# Patient Record
Sex: Female | Born: 1935 | Race: White | Hispanic: No | Marital: Married
Health system: Southern US, Community
[De-identification: ages and names within clinical notes are randomized; demographics above are authoritative.]

## PROBLEM LIST (undated history)

## (undated) DIAGNOSIS — I2699 Other pulmonary embolism without acute cor pulmonale: Secondary | ICD-10-CM

## (undated) DIAGNOSIS — J9621 Acute and chronic respiratory failure with hypoxia: Secondary | ICD-10-CM

## (undated) DIAGNOSIS — N182 Chronic kidney disease, stage 2 (mild): Secondary | ICD-10-CM

## (undated) DIAGNOSIS — J189 Pneumonia, unspecified organism: Secondary | ICD-10-CM

## (undated) DIAGNOSIS — U071 COVID-19: Secondary | ICD-10-CM

---

## 2020-03-16 ENCOUNTER — Inpatient Hospital Stay
Admission: AD | Admit: 2020-03-16 | Discharge: 2020-03-30 | Disposition: A | Discharge status: Skilled Nursing Facility | Payer: Medicare Other | Source: Other Acute Inpatient Hospital | Attending: Internal Medicine | Admitting: Internal Medicine

## 2020-03-16 DIAGNOSIS — J9690 Respiratory failure, unspecified, unspecified whether with hypoxia or hypercapnia: Secondary | ICD-10-CM

## 2020-03-16 DIAGNOSIS — I2699 Other pulmonary embolism without acute cor pulmonale: Secondary | ICD-10-CM | POA: Diagnosis present

## 2020-03-16 DIAGNOSIS — U071 COVID-19: Secondary | ICD-10-CM | POA: Diagnosis present

## 2020-03-16 DIAGNOSIS — J9621 Acute and chronic respiratory failure with hypoxia: Secondary | ICD-10-CM | POA: Diagnosis present

## 2020-03-16 DIAGNOSIS — J188 Other pneumonia, unspecified organism: Secondary | ICD-10-CM | POA: Diagnosis present

## 2020-03-16 DIAGNOSIS — N182 Chronic kidney disease, stage 2 (mild): Secondary | ICD-10-CM | POA: Diagnosis present

## 2020-03-16 DIAGNOSIS — J189 Pneumonia, unspecified organism: Secondary | ICD-10-CM | POA: Diagnosis present

## 2020-03-16 HISTORY — DX: Pneumonia, unspecified organism: J18.9

## 2020-03-16 HISTORY — DX: COVID-19: U07.1

## 2020-03-16 HISTORY — DX: Chronic kidney disease, stage 2 (mild): N18.2

## 2020-03-16 HISTORY — DX: Other pulmonary embolism without acute cor pulmonale: I26.99

## 2020-03-16 HISTORY — DX: Acute and chronic respiratory failure with hypoxia: J96.21

## 2020-03-17 ENCOUNTER — Other Ambulatory Visit (HOSPITAL_COMMUNITY): Payer: Medicare Other

## 2020-03-17 LAB — COMPREHENSIVE METABOLIC PANEL
ALT: 19 U/L (ref 0–44)
AST: 17 U/L (ref 15–41)
Albumin: 2.1 g/dL — ABNORMAL LOW (ref 3.5–5.0)
Alkaline Phosphatase: 76 U/L (ref 38–126)
Anion gap: 5 (ref 5–15)
BUN: 23 mg/dL (ref 8–23)
CO2: 27 mmol/L (ref 22–32)
Calcium: 8.1 mg/dL — ABNORMAL LOW (ref 8.9–10.3)
Chloride: 104 mmol/L (ref 98–111)
Creatinine, Ser: 0.9 mg/dL (ref 0.44–1.00)
GFR calc Af Amer: >60 mL/min (ref >60)
GFR calc non Af Amer: 58 mL/min — ABNORMAL LOW (ref >60)
Glucose, Bld: 112 mg/dL — ABNORMAL HIGH (ref 70–99)
Potassium: 4.3 mmol/L (ref 3.5–5.1)
Sodium: 136 mmol/L (ref 135–145)
Total Bilirubin: 0.5 mg/dL (ref 0.3–1.2)
Total Protein: 5.2 g/dL — ABNORMAL LOW (ref 6.5–8.1)

## 2020-03-17 LAB — URINALYSIS, ROUTINE W REFLEX MICROSCOPIC
Bilirubin Urine: NEGATIVE
Glucose, UA: NEGATIVE mg/dL
Hgb urine dipstick: NEGATIVE
Ketones, ur: NEGATIVE mg/dL
Leukocytes,Ua: NEGATIVE
Nitrite: NEGATIVE
Protein, ur: NEGATIVE mg/dL
Specific Gravity, Urine: 1.018 (ref 1.005–1.030)
pH: 7 (ref 5.0–8.0)

## 2020-03-17 LAB — PROTIME-INR
INR: 1.6 — ABNORMAL HIGH (ref 0.8–1.2)
Prothrombin Time: 18.8 seconds — ABNORMAL HIGH (ref 11.4–15.2)

## 2020-03-17 LAB — CBC
HCT: 34.6 % — ABNORMAL LOW (ref 36.0–46.0)
Hemoglobin: 11.1 g/dL — ABNORMAL LOW (ref 12.0–15.0)
MCH: 29 pg (ref 26.0–34.0)
MCHC: 32.1 g/dL (ref 30.0–36.0)
MCV: 90.3 fL (ref 80.0–100.0)
Platelets: 271 10*3/uL (ref 150–400)
RBC: 3.83 MIL/uL — ABNORMAL LOW (ref 3.87–5.11)
RDW: 14.9 % (ref 11.5–15.5)
WBC: 16.5 10*3/uL — ABNORMAL HIGH (ref 4.0–10.5)
nRBC: 0 % (ref 0.0–0.2)

## 2020-03-17 LAB — PHOSPHORUS: Phosphorus: 2.8 mg/dL (ref 2.5–4.6)

## 2020-03-17 LAB — MAGNESIUM: Magnesium: 2.3 mg/dL (ref 1.7–2.4)

## 2020-03-18 ENCOUNTER — Encounter: Payer: Self-pay | Admitting: Internal Medicine

## 2020-03-18 DIAGNOSIS — J9621 Acute and chronic respiratory failure with hypoxia: Secondary | ICD-10-CM

## 2020-03-18 DIAGNOSIS — I2699 Other pulmonary embolism without acute cor pulmonale: Secondary | ICD-10-CM | POA: Diagnosis not present

## 2020-03-18 DIAGNOSIS — U071 COVID-19: Secondary | ICD-10-CM | POA: Diagnosis present

## 2020-03-18 DIAGNOSIS — J188 Other pneumonia, unspecified organism: Secondary | ICD-10-CM | POA: Diagnosis present

## 2020-03-18 DIAGNOSIS — J189 Pneumonia, unspecified organism: Secondary | ICD-10-CM

## 2020-03-18 DIAGNOSIS — N182 Chronic kidney disease, stage 2 (mild): Secondary | ICD-10-CM | POA: Diagnosis present

## 2020-03-18 LAB — URINE CULTURE: Culture: NO GROWTH

## 2020-03-18 NOTE — Consult Note (Signed)
Pulmonary Critical Care Medicine [name]  PULMONARY SERVICE  Date of Service: 03/18/2020  PULMONARY CRITICAL CARE CONSULT   Jamie Blackburn  YIF:[id number]  DOB: January 04, 1931   DOA: 03/16/2020  Referring Physician: Carron Curie, MD  HPI: Jamie Blackburn is a 86 y.o. female seen for follow up of Acute on Chronic Respiratory Failure.  Patient has multiple medical problems including chronic anemia GERD hypertension and chronic kidney disease fibromyalgia who presented to the hospital with increasing fatigue.  Patient had a chest x-ray done which was concerning for pneumonia.  COVID-19 test was performed and it was positive.  Patient was started on steroids remdesivir oxygen was increased to 12 L.  Subsequently patient had a gradually weaned down.  Chest CT angiogram was done which showed bilateral pulmonary embolism.  Patient was started on anticoagulation transitioned over to Eliquis.  Transferred now to our facility for further management and weaning  Review of Systems:  ROS performed and is unremarkable other than noted above.  Past medical history: Chronic anemia Chronic kidney disease Fibromyalgia GERD Hypertension Iron deficiency  Past surgical history: Appendectomy colonoscopy Hysterectomy Total knee arthroplasty Wrist fracture surgery  Social history: Negative for alcohol tobacco or drug abuse  Family history: Noncontributory to present illness  Allergies: Morphine Sulfa  Medications: Reviewed on Rounds  Physical Exam:  Vitals: Temperature 95.8 pulse 82 respiratory rate is 16 blood pressure is 122/59 saturations 98%  Ventilator Settings currently on 6 L off the ventilator  . General: Comfortable at this time . Eyes: Grossly normal lids, irises & conjunctiva . ENT: grossly tongue is normal . Neck: no obvious mass . Cardiovascular: S1-S2 normal no gallop or rub . Respiratory: No rhonchi no rales are noted at this time . Abdomen: Soft and  nontender . Skin: no rash seen on limited exam . Musculoskeletal: not rigid . Psychiatric:unable to assess . Neurologic: no seizure no involuntary movements         Labs on Admission:  Basic Metabolic Panel: Recent Labs  Lab 03/17/20 0641  NA 136  K 4.3  CL 104  CO2 27  GLUCOSE 112*  BUN 23  CREATININE 0.90  CALCIUM 8.1*  MG 2.3  PHOS 2.8    No results for input(s): PHART, PCO2ART, PO2ART, HCO3, O2SAT in the last 168 hours.  Liver Function Tests: Recent Labs  Lab 03/17/20 0641  AST 17  ALT 19  ALKPHOS 76  BILITOT 0.5  PROT 5.2*  ALBUMIN 2.1*   No results for input(s): LIPASE, AMYLASE in the last 168 hours. No results for input(s): AMMONIA in the last 168 hours.  CBC: Recent Labs  Lab 03/17/20 0641  WBC 16.5*  HGB 11.1*  HCT 34.6*  MCV 90.3  PLT 271    Cardiac Enzymes: No results for input(s): CKTOTAL, CKMB, CKMBINDEX, TROPONINI in the last 168 hours.  BNP (last 3 results) No results for input(s): BNP in the last 8760 hours.  ProBNP (last 3 results) No results for input(s): PROBNP in the last 8760 hours.   Radiological Exams on Admission: DG CHEST PORT 1 VIEW  Result Date: 03/17/2020 CLINICAL DATA:  Respiratory failure EXAM: PORTABLE CHEST 1 VIEW COMPARISON:  None. FINDINGS: The heart size and mediastinal contours are within normal limits. There is hazy patchy airspace opacity seen at the periphery of the left lower lung. There is prominence of the central pulmonary vasculature. No pleural effusion is seen. No acute osseous abnormality IMPRESSION: Airspace opacity at the periphery of the left lower  lobe which may be due to atelectasis and/or infectious etiology. Electronically Signed   By: Prudencio Pair M.D.   On: 03/17/2020 06:15    Assessment/Plan Active Problems:   Acute on chronic respiratory failure with hypoxia (HCC)   COVID-19 virus infection   Multifocal pneumonia   Bilateral pulmonary embolism (HCC)   Chronic kidney disease, stage II  (mild)   1. Acute on chronic respiratory failure with hypoxia patient currently is weaned down to 6 L of O2 we will continue to decrease oxygen as tolerated. 2. COVID-19 virus infection now is in recovery phase plan is going to be to continue to monitor oxygen requirements and wean as ordered. 3. Bilateral pulmonary embolisms treated on Eliquis we will continue with the anticoagulation. 4. Chronic kidney disease stage 2 supportive care monitor labs we will continue to follow along. 5. Bilateral multifocal pneumonia treated patient still has some airspace disease left over we will continue to follow along.  I have personally seen and evaluated the patient, evaluated laboratory and imaging results, formulated the assessment and plan and placed orders. The Patient requires high complexity decision making with multiple systems involvement.  Case was discussed on Rounds with the Respiratory Therapy Director and the Respiratory staff Time Spent 38minutes  Aitana Burry A Jaely Silman, MD [facility] Pulmonary Critical Care Medicine Sleep Medicine

## 2020-03-19 DIAGNOSIS — U071 COVID-19: Secondary | ICD-10-CM | POA: Diagnosis not present

## 2020-03-19 DIAGNOSIS — J9621 Acute and chronic respiratory failure with hypoxia: Secondary | ICD-10-CM | POA: Diagnosis not present

## 2020-03-19 DIAGNOSIS — I2699 Other pulmonary embolism without acute cor pulmonale: Secondary | ICD-10-CM | POA: Diagnosis not present

## 2020-03-19 DIAGNOSIS — N182 Chronic kidney disease, stage 2 (mild): Secondary | ICD-10-CM | POA: Diagnosis not present

## 2020-03-19 NOTE — Progress Notes (Signed)
Pulmonary Critical Care Medicine [facility] [place]   PULMONARY CRITICAL CARE SERVICE  PROGRESS NOTE  Date of Service: 03/19/2020  Jamie Blackburn  QVZ:[id number]  DOB: 11-Mar-1931   DOA: 03/16/2020  Referring Physician: Carron Curie, MD  HPI: Jamie Blackburn is a 86 y.o. female seen for follow up of Acute on Chronic Respiratory Failure.  Patient is on 6 L O2 good saturations are noted  Medications: Reviewed on Rounds  Physical Exam:  Vitals: Temperature 95.8 pulse 90 respiratory rate 16 blood pressures 130/92 saturations 100%  Ventilator Settings on 6 L oxygen good saturations  . General: Comfortable at this time . Eyes: Grossly normal lids, irises & conjunctiva . ENT: grossly tongue is normal . Neck: no obvious mass . Cardiovascular: S1 S2 normal no gallop . Respiratory: No rhonchi coarse breath sounds . Abdomen: soft . Skin: no rash seen on limited exam . Musculoskeletal: not rigid . Psychiatric:unable to assess . Neurologic: no seizure no involuntary movements         Lab Data:   Basic Metabolic Panel: Recent Labs  Lab 03/17/20 0641  NA 136  K 4.3  CL 104  CO2 27  GLUCOSE 112*  BUN 23  CREATININE 0.90  CALCIUM 8.1*  MG 2.3  PHOS 2.8    ABG: No results for input(s): PHART, PCO2ART, PO2ART, HCO3, O2SAT in the last 168 hours.  Liver Function Tests: Recent Labs  Lab 03/17/20 0641  AST 17  ALT 19  ALKPHOS 76  BILITOT 0.5  PROT 5.2*  ALBUMIN 2.1*   No results for input(s): LIPASE, AMYLASE in the last 168 hours. No results for input(s): AMMONIA in the last 168 hours.  CBC: Recent Labs  Lab 03/17/20 0641  WBC 16.5*  HGB 11.1*  HCT 34.6*  MCV 90.3  PLT 271    Cardiac Enzymes: No results for input(s): CKTOTAL, CKMB, CKMBINDEX, TROPONINI in the last 168 hours.  BNP (last 3 results) No results for input(s): BNP in the last 8760 hours.  ProBNP (last 3 results) No results for input(s): PROBNP in the last 8760  hours.  Radiological Exams: No results found.  Assessment/Plan Active Problems:   Acute on chronic respiratory failure with hypoxia (HCC)   COVID-19 virus infection   Multifocal pneumonia   Bilateral pulmonary embolism (HCC)   Chronic kidney disease, stage II (mild)   1. Acute on chronic respiratory failure hypoxia continue with oxygen therapy right now on 6 L O2 2. COVID-19 virus infection treated clinically is improving 3. Multifocal pneumonia clinically improving 4. Bilateral pulmonary embolism no change we will continue to follow 5. Chronic kidney disease stage III supportive care monitoring labs   I have personally seen and evaluated the patient, evaluated laboratory and imaging results, formulated the assessment and plan and placed orders. The Patient requires high complexity decision making with multiple systems involvement.  Rounds were done with the Respiratory Therapy Director and Staff therapists and discussed with nursing staff also.  Yevonne Pax, MD [facility] Pulmonary Critical Care Medicine Sleep Medicine

## 2020-03-20 DIAGNOSIS — J9621 Acute and chronic respiratory failure with hypoxia: Secondary | ICD-10-CM | POA: Diagnosis not present

## 2020-03-20 DIAGNOSIS — N182 Chronic kidney disease, stage 2 (mild): Secondary | ICD-10-CM | POA: Diagnosis not present

## 2020-03-20 DIAGNOSIS — U071 COVID-19: Secondary | ICD-10-CM | POA: Diagnosis not present

## 2020-03-20 DIAGNOSIS — I2699 Other pulmonary embolism without acute cor pulmonale: Secondary | ICD-10-CM | POA: Diagnosis not present

## 2020-03-20 LAB — BASIC METABOLIC PANEL
Anion gap: 7 (ref 5–15)
BUN: 26 mg/dL — ABNORMAL HIGH (ref 8–23)
CO2: 25 mmol/L (ref 22–32)
Calcium: 8.6 mg/dL — ABNORMAL LOW (ref 8.9–10.3)
Chloride: 108 mmol/L (ref 98–111)
Creatinine, Ser: 0.78 mg/dL (ref 0.44–1.00)
GFR calc Af Amer: >60 mL/min (ref >60)
GFR calc non Af Amer: >60 mL/min (ref >60)
Glucose, Bld: 102 mg/dL — ABNORMAL HIGH (ref 70–99)
Potassium: 4.7 mmol/L (ref 3.5–5.1)
Sodium: 140 mmol/L (ref 135–145)

## 2020-03-20 LAB — CBC
HCT: 30.8 % — ABNORMAL LOW (ref 36.0–46.0)
Hemoglobin: 10 g/dL — ABNORMAL LOW (ref 12.0–15.0)
MCH: 29.2 pg (ref 26.0–34.0)
MCHC: 32.5 g/dL (ref 30.0–36.0)
MCV: 90.1 fL (ref 80.0–100.0)
Platelets: 294 10*3/uL (ref 150–400)
RBC: 3.42 MIL/uL — ABNORMAL LOW (ref 3.87–5.11)
RDW: 14.7 % (ref 11.5–15.5)
WBC: 12.9 10*3/uL — ABNORMAL HIGH (ref 4.0–10.5)
nRBC: 0 % (ref 0.0–0.2)

## 2020-03-20 LAB — MAGNESIUM: Magnesium: 2.1 mg/dL (ref 1.7–2.4)

## 2020-03-20 NOTE — Progress Notes (Signed)
Pulmonary Critical Care Medicine [facility] [place]   PULMONARY CRITICAL CARE SERVICE  PROGRESS NOTE  Date of Service: 03/20/2020  Jamie Blackburn  YPP:[id number]  DOB: [birth year]   DOA: 03/16/2020  Referring Physician: Carron Curie, MD  HPI: Jamie Blackburn is a 86 y.o. female seen for follow up of Acute on Chronic Respiratory Failure.  Patient is on 6 L O2 saturations are good  Medications: Reviewed on Rounds  Physical Exam:  Vitals: Temperature is 97.4 pulse 89 respiratory rate 18 blood pressure is 104/60 saturations 99%  Ventilator Settings on 6 L oxygen  . General: Comfortable at this time . Eyes: Grossly normal lids, irises & conjunctiva . ENT: grossly tongue is normal . Neck: no obvious mass . Cardiovascular: S1 S2 normal no gallop . Respiratory: No rhonchi coarse breath sounds . Abdomen: soft . Skin: no rash seen on limited exam . Musculoskeletal: not rigid . Psychiatric:unable to assess . Neurologic: no seizure no involuntary movements         Lab Data:   Basic Metabolic Panel: Recent Labs  Lab 03/17/20 0641 03/20/20 0701  NA 136 140  K 4.3 4.7  CL 104 108  CO2 27 25  GLUCOSE 112* 102*  BUN 23 26*  CREATININE 0.90 0.78  CALCIUM 8.1* 8.6*  MG 2.3 2.1  PHOS 2.8  --     ABG: No results for input(s): PHART, PCO2ART, PO2ART, HCO3, O2SAT in the last 168 hours.  Liver Function Tests: Recent Labs  Lab 03/17/20 0641  AST 17  ALT 19  ALKPHOS 76  BILITOT 0.5  PROT 5.2*  ALBUMIN 2.1*   No results for input(s): LIPASE, AMYLASE in the last 168 hours. No results for input(s): AMMONIA in the last 168 hours.  CBC: Recent Labs  Lab 03/17/20 0641 03/20/20 0701  WBC 16.5* 12.9*  HGB 11.1* 10.0*  HCT 34.6* 30.8*  MCV 90.3 90.1  PLT 271 294    Cardiac Enzymes: No results for input(s): CKTOTAL, CKMB, CKMBINDEX, TROPONINI in the last 168 hours.  BNP (last 3 results) No results for input(s): BNP in the last 8760 hours.  ProBNP (last  3 results) No results for input(s): PROBNP in the last 8760 hours.  Radiological Exams: No results found.  Assessment/Plan Active Problems:   Acute on chronic respiratory failure with hypoxia (HCC)   COVID-19 virus infection   Multifocal pneumonia   Bilateral pulmonary embolism (HCC)   Chronic kidney disease, stage II (mild)   1. Acute on chronic respiratory failure hypoxia we will continue with oxygen therapy and supportive care. 2. COVID-19 virus infection treated continue to follow 3. Multifocal pneumonia treated improving slowly 4. Bilateral pulmonary embolism treated continue with supportive care 5. Chronic kidney disease stage II following labs   I have personally seen and evaluated the patient, evaluated laboratory and imaging results, formulated the assessment and plan and placed orders. The Patient requires high complexity decision making with multiple systems involvement.  Rounds were done with the Respiratory Therapy Director and Staff therapists and discussed with nursing staff also.  Yevonne Pax, MD [facility] Pulmonary Critical Care Medicine Sleep Medicine

## 2020-03-21 DIAGNOSIS — N182 Chronic kidney disease, stage 2 (mild): Secondary | ICD-10-CM | POA: Diagnosis not present

## 2020-03-21 DIAGNOSIS — I2699 Other pulmonary embolism without acute cor pulmonale: Secondary | ICD-10-CM | POA: Diagnosis not present

## 2020-03-21 DIAGNOSIS — U071 COVID-19: Secondary | ICD-10-CM | POA: Diagnosis not present

## 2020-03-21 DIAGNOSIS — J9621 Acute and chronic respiratory failure with hypoxia: Secondary | ICD-10-CM | POA: Diagnosis not present

## 2020-03-21 NOTE — Progress Notes (Signed)
Pulmonary Critical Care Medicine [facility] [place]   PULMONARY CRITICAL CARE SERVICE  PROGRESS NOTE  Date of Service: 03/21/2020  Jamie Blackburn  EXN:[id number]  DOB: 1930-12-02   DOA: 03/16/2020  Referring Physician: Carron Curie, MD  HPI: Jamie Blackburn is a 86 y.o. female seen for follow up of Acute on Chronic Respiratory Failure.  Patient is off the ventilator right now is on 6 L O2 good saturations are noted  Medications: Reviewed on Rounds  Physical Exam:  Vitals: Temperature is 97.9 pulse 84 respiratory 20 blood pressure is 119/61 saturations 100%  Ventilator Settings on 6 L O2  . General: Comfortable at this time . Eyes: Grossly normal lids, irises & conjunctiva . ENT: grossly tongue is normal . Neck: no obvious mass . Cardiovascular: S1 S2 normal no gallop . Respiratory: No rhonchi no rales are noted at this time . Abdomen: soft . Skin: no rash seen on limited exam . Musculoskeletal: not rigid . Psychiatric:unable to assess . Neurologic: no seizure no involuntary movements         Lab Data:   Basic Metabolic Panel: Recent Labs  Lab 03/17/20 0641 03/20/20 0701  NA 136 140  K 4.3 4.7  CL 104 108  CO2 27 25  GLUCOSE 112* 102*  BUN 23 26*  CREATININE 0.90 0.78  CALCIUM 8.1* 8.6*  MG 2.3 2.1  PHOS 2.8  --     ABG: No results for input(s): PHART, PCO2ART, PO2ART, HCO3, O2SAT in the last 168 hours.  Liver Function Tests: Recent Labs  Lab 03/17/20 0641  AST 17  ALT 19  ALKPHOS 76  BILITOT 0.5  PROT 5.2*  ALBUMIN 2.1*   No results for input(s): LIPASE, AMYLASE in the last 168 hours. No results for input(s): AMMONIA in the last 168 hours.  CBC: Recent Labs  Lab 03/17/20 0641 03/20/20 0701  WBC 16.5* 12.9*  HGB 11.1* 10.0*  HCT 34.6* 30.8*  MCV 90.3 90.1  PLT 271 294    Cardiac Enzymes: No results for input(s): CKTOTAL, CKMB, CKMBINDEX, TROPONINI in the last 168 hours.  BNP (last 3 results) No results for input(s): BNP  in the last 8760 hours.  ProBNP (last 3 results) No results for input(s): PROBNP in the last 8760 hours.  Radiological Exams: No results found.  Assessment/Plan Active Problems:   Acute on chronic respiratory failure with hypoxia (HCC)   COVID-19 virus infection   Multifocal pneumonia   Bilateral pulmonary embolism (HCC)   Chronic kidney disease, stage II (mild)   1. Acute on chronic respiratory failure with hypoxia continue with oxygen therapy wean on 6 L try to wean down 2. COVID-19 virus infection in recovery phase continue to follow 3. Multifocal pneumonia supportive care we will continue with present management 4. Bilateral pulmonary embolism 5. Chronic kidney disease stage II continue to follow labs   I have personally seen and evaluated the patient, evaluated laboratory and imaging results, formulated the assessment and plan and placed orders. The Patient requires high complexity decision making with multiple systems involvement.  Rounds were done with the Respiratory Therapy Director and Staff therapists and discussed with nursing staff also.  Yevonne Pax, MD [facility] Pulmonary Critical Care Medicine Sleep Medicine

## 2020-03-22 DIAGNOSIS — I2699 Other pulmonary embolism without acute cor pulmonale: Secondary | ICD-10-CM | POA: Diagnosis not present

## 2020-03-22 DIAGNOSIS — U071 COVID-19: Secondary | ICD-10-CM | POA: Diagnosis not present

## 2020-03-22 DIAGNOSIS — J9621 Acute and chronic respiratory failure with hypoxia: Secondary | ICD-10-CM | POA: Diagnosis not present

## 2020-03-22 DIAGNOSIS — N182 Chronic kidney disease, stage 2 (mild): Secondary | ICD-10-CM | POA: Diagnosis not present

## 2020-03-22 NOTE — Progress Notes (Signed)
Pulmonary Critical Care Medicine [facility] [place]   PULMONARY CRITICAL CARE SERVICE  PROGRESS NOTE  Date of Service: 03/22/2020  Jamie Blackburn  [id number]  DOB: 02-Apr-1931   DOA: 03/16/2020  Referring Physician: Carron Curie, MD  HPI: Jamie Blackburn is a 86 y.o. female seen for follow up of Acute on Chronic Respiratory Failure.  Currently is on 6 L oxygen doing well  Medications: Reviewed on Rounds  Physical Exam:  Vitals: Temperature is 97.4 pulse 77 respiratory rate 17 blood pressure is 130/73 saturations 100%  Ventilator Settings on 6 L O2  . General: Comfortable at this time . Eyes: Grossly normal lids, irises & conjunctiva . ENT: grossly tongue is normal . Neck: no obvious mass . Cardiovascular: S1 S2 normal no gallop . Respiratory: No rhonchi no rales are noted at this time . Abdomen: soft . Skin: no rash seen on limited exam . Musculoskeletal: not rigid . Psychiatric:unable to assess . Neurologic: no seizure no involuntary movements         Lab Data:   Basic Metabolic Panel: Recent Labs  Lab 03/17/20 0641 03/20/20 0701  NA 136 140  K 4.3 4.7  CL 104 108  CO2 27 25  GLUCOSE 112* 102*  BUN 23 26*  CREATININE 0.90 0.78  CALCIUM 8.1* 8.6*  MG 2.3 2.1  PHOS 2.8  --     ABG: No results for input(s): PHART, PCO2ART, PO2ART, HCO3, O2SAT in the last 168 hours.  Liver Function Tests: Recent Labs  Lab 03/17/20 0641  AST 17  ALT 19  ALKPHOS 76  BILITOT 0.5  PROT 5.2*  ALBUMIN 2.1*   No results for input(s): LIPASE, AMYLASE in the last 168 hours. No results for input(s): AMMONIA in the last 168 hours.  CBC: Recent Labs  Lab 03/17/20 0641 03/20/20 0701  WBC 16.5* 12.9*  HGB 11.1* 10.0*  HCT 34.6* 30.8*  MCV 90.3 90.1  PLT 271 294    Cardiac Enzymes: No results for input(s): CKTOTAL, CKMB, CKMBINDEX, TROPONINI in the last 168 hours.  BNP (last 3 results) No results for input(s): BNP in the last 8760 hours.  ProBNP  (last 3 results) No results for input(s): PROBNP in the last 8760 hours.  Radiological Exams: No results found.  Assessment/Plan Active Problems:   Acute on chronic respiratory failure with hypoxia (HCC)   COVID-19 virus infection   Multifocal pneumonia   Bilateral pulmonary embolism (HCC)   Chronic kidney disease, stage II (mild)   1. Acute on chronic respiratory failure hypoxia plan is to continue with oxygen therapy titrate as tolerated continue pulmonary toilet. 2. COVID-19 virus infection resolved 3. Multifocal pneumonia clinically improving 4. Bilateral pulmonary embolism continue with present management 5. Chronic kidney disease stage II supportive care   I have personally seen and evaluated the patient, evaluated laboratory and imaging results, formulated the assessment and plan and placed orders. The Patient requires high complexity decision making with multiple systems involvement.  Rounds were done with the Respiratory Therapy Director and Staff therapists and discussed with nursing staff also.  Yevonne Pax, MD [facility] Pulmonary Critical Care Medicine Sleep Medicine

## 2020-03-23 DIAGNOSIS — J9621 Acute and chronic respiratory failure with hypoxia: Secondary | ICD-10-CM | POA: Diagnosis not present

## 2020-03-23 DIAGNOSIS — N182 Chronic kidney disease, stage 2 (mild): Secondary | ICD-10-CM | POA: Diagnosis not present

## 2020-03-23 DIAGNOSIS — I2699 Other pulmonary embolism without acute cor pulmonale: Secondary | ICD-10-CM | POA: Diagnosis not present

## 2020-03-23 DIAGNOSIS — U071 COVID-19: Secondary | ICD-10-CM | POA: Diagnosis not present

## 2020-03-23 LAB — CBC
HCT: 31.2 % — ABNORMAL LOW (ref 36.0–46.0)
Hemoglobin: 9.8 g/dL — ABNORMAL LOW (ref 12.0–15.0)
MCH: 28.9 pg (ref 26.0–34.0)
MCHC: 31.4 g/dL (ref 30.0–36.0)
MCV: 92 fL (ref 80.0–100.0)
Platelets: 289 10*3/uL (ref 150–400)
RBC: 3.39 MIL/uL — ABNORMAL LOW (ref 3.87–5.11)
RDW: 14.9 % (ref 11.5–15.5)
WBC: 12.4 10*3/uL — ABNORMAL HIGH (ref 4.0–10.5)
nRBC: 0 % (ref 0.0–0.2)

## 2020-03-23 LAB — BASIC METABOLIC PANEL
Anion gap: 7 (ref 5–15)
BUN: 31 mg/dL — ABNORMAL HIGH (ref 8–23)
CO2: 27 mmol/L (ref 22–32)
Calcium: 8.8 mg/dL — ABNORMAL LOW (ref 8.9–10.3)
Chloride: 105 mmol/L (ref 98–111)
Creatinine, Ser: 0.86 mg/dL (ref 0.44–1.00)
GFR calc Af Amer: >60 mL/min (ref >60)
GFR calc non Af Amer: >60 mL/min (ref >60)
Glucose, Bld: 96 mg/dL (ref 70–99)
Potassium: 4.8 mmol/L (ref 3.5–5.1)
Sodium: 139 mmol/L (ref 135–145)

## 2020-03-23 LAB — MAGNESIUM: Magnesium: 2.1 mg/dL (ref 1.7–2.4)

## 2020-03-23 LAB — PHOSPHORUS: Phosphorus: 3 mg/dL (ref 2.5–4.6)

## 2020-03-23 NOTE — Progress Notes (Addendum)
Pulmonary Critical Care Medicine [facility] [place]   PULMONARY CRITICAL CARE SERVICE  PROGRESS NOTE  Date of Service: 03/23/2020  Jamie Blackburn  [id number]  DOB: 1931/04/21   DOA: 03/16/2020  Referring Physician: Carron Curie, MD  HPI: Jamie Blackburn is a 86 y.o. female seen for follow up of Acute on Chronic Respiratory Failure.  Patient remains on nasal cannula at this time satting well no fever or distress.    Medications: Reviewed on Rounds  Physical Exam:  Vitals: Pulse 87 respirations 18 BP 143/68 O2 sat 98% temp 96.7  Ventilator Settings not currently on ventilator  . General: Comfortable at this time . Eyes: Grossly normal lids, irises & conjunctiva . ENT: grossly tongue is normal . Neck: no obvious mass . Cardiovascular: S1 S2 normal no gallop . Respiratory: No rales or rhonchi noted . Abdomen: soft . Skin: no rash seen on limited exam . Musculoskeletal: not rigid . Psychiatric:unable to assess . Neurologic: no seizure no involuntary movements         Lab Data:   Basic Metabolic Panel: Recent Labs  Lab 03/17/20 0641 03/20/20 0701 03/23/20 0616  NA 136 140 139  K 4.3 4.7 4.8  CL 104 108 105  CO2 27 25 27   GLUCOSE 112* 102* 96  BUN 23 26* 31*  CREATININE 0.90 0.78 0.86  CALCIUM 8.1* 8.6* 8.8*  MG 2.3 2.1 2.1  PHOS 2.8  --  3.0    ABG: No results for input(s): PHART, PCO2ART, PO2ART, HCO3, O2SAT in the last 168 hours.  Liver Function Tests: Recent Labs  Lab 03/17/20 0641  AST 17  ALT 19  ALKPHOS 76  BILITOT 0.5  PROT 5.2*  ALBUMIN 2.1*   No results for input(s): LIPASE, AMYLASE in the last 168 hours. No results for input(s): AMMONIA in the last 168 hours.  CBC: Recent Labs  Lab 03/17/20 0641 03/20/20 0701 03/23/20 0616  WBC 16.5* 12.9* 12.4*  HGB 11.1* 10.0* 9.8*  HCT 34.6* 30.8* 31.2*  MCV 90.3 90.1 92.0  PLT 271 294 289    Cardiac Enzymes: No results for input(s): CKTOTAL, CKMB, CKMBINDEX, TROPONINI in the  last 168 hours.  BNP (last 3 results) No results for input(s): BNP in the last 8760 hours.  ProBNP (last 3 results) No results for input(s): PROBNP in the last 8760 hours.  Radiological Exams: No results found.  Assessment/Plan Active Problems:   Acute on chronic respiratory failure with hypoxia (HCC)   COVID-19 virus infection   Multifocal pneumonia   Bilateral pulmonary embolism (HCC)   Chronic kidney disease, stage II (mild)   1. Acute on chronic respiratory failure hypoxia continue with oxygen therapy and titrate FiO2 as possible.  Currently on nasal cannula 5 L.  2. COVID-19 virus infection resolved 3. Multifocal pneumonia clinically improving 4. Bilateral pulmonary embolism continue with present management 5. Chronic kidney disease stage II supportive care   I have personally seen and evaluated the patient, evaluated laboratory and imaging results, formulated the assessment and plan and placed orders. The Patient requires high complexity decision making with multiple systems involvement.  Rounds were done with the Respiratory Therapy Director and Staff therapists and discussed with nursing staff also.  03/25/20, MD [facility] Pulmonary Critical Care Medicine Sleep Medicine

## 2020-03-25 ENCOUNTER — Other Ambulatory Visit (HOSPITAL_COMMUNITY): Payer: Medicare Other

## 2020-03-26 LAB — BASIC METABOLIC PANEL
Anion gap: 8 (ref 5–15)
BUN: 35 mg/dL — ABNORMAL HIGH (ref 8–23)
CO2: 27 mmol/L (ref 22–32)
Calcium: 8.7 mg/dL — ABNORMAL LOW (ref 8.9–10.3)
Chloride: 104 mmol/L (ref 98–111)
Creatinine, Ser: 0.75 mg/dL (ref 0.44–1.00)
GFR calc Af Amer: >60 mL/min (ref >60)
GFR calc non Af Amer: >60 mL/min (ref >60)
Glucose, Bld: 105 mg/dL — ABNORMAL HIGH (ref 70–99)
Potassium: 4.4 mmol/L (ref 3.5–5.1)
Sodium: 139 mmol/L (ref 135–145)

## 2020-03-26 LAB — CBC
HCT: 28.8 % — ABNORMAL LOW (ref 36.0–46.0)
Hemoglobin: 9.2 g/dL — ABNORMAL LOW (ref 12.0–15.0)
MCH: 29.8 pg (ref 26.0–34.0)
MCHC: 31.9 g/dL (ref 30.0–36.0)
MCV: 93.2 fL (ref 80.0–100.0)
Platelets: 283 10*3/uL (ref 150–400)
RBC: 3.09 MIL/uL — ABNORMAL LOW (ref 3.87–5.11)
RDW: 15.9 % — ABNORMAL HIGH (ref 11.5–15.5)
WBC: 15.8 10*3/uL — ABNORMAL HIGH (ref 4.0–10.5)
nRBC: 0 % (ref 0.0–0.2)

## 2020-03-26 LAB — MAGNESIUM: Magnesium: 2.1 mg/dL (ref 1.7–2.4)

## 2020-03-26 LAB — PHOSPHORUS: Phosphorus: 3.1 mg/dL (ref 2.5–4.6)

## 2020-03-29 LAB — CBC
HCT: 29.3 % — ABNORMAL LOW (ref 36.0–46.0)
Hemoglobin: 9.1 g/dL — ABNORMAL LOW (ref 12.0–15.0)
MCH: 29.3 pg (ref 26.0–34.0)
MCHC: 31.1 g/dL (ref 30.0–36.0)
MCV: 94.2 fL (ref 80.0–100.0)
Platelets: 257 10*3/uL (ref 150–400)
RBC: 3.11 MIL/uL — ABNORMAL LOW (ref 3.87–5.11)
RDW: 17.2 % — ABNORMAL HIGH (ref 11.5–15.5)
WBC: 8.9 10*3/uL (ref 4.0–10.5)
nRBC: 0 % (ref 0.0–0.2)

## 2020-03-29 LAB — BASIC METABOLIC PANEL
Anion gap: 5 (ref 5–15)
BUN: 29 mg/dL — ABNORMAL HIGH (ref 8–23)
CO2: 31 mmol/L (ref 22–32)
Calcium: 8.8 mg/dL — ABNORMAL LOW (ref 8.9–10.3)
Chloride: 105 mmol/L (ref 98–111)
Creatinine, Ser: 0.71 mg/dL (ref 0.44–1.00)
GFR calc Af Amer: >60 mL/min (ref >60)
GFR calc non Af Amer: >60 mL/min (ref >60)
Glucose, Bld: 95 mg/dL (ref 70–99)
Potassium: 4.3 mmol/L (ref 3.5–5.1)
Sodium: 141 mmol/L (ref 135–145)

## 2020-03-29 LAB — MAGNESIUM: Magnesium: 2.1 mg/dL (ref 1.7–2.4)

## 2020-03-30 LAB — NOVEL CORONAVIRUS, NAA (HOSP ORDER, SEND-OUT TO REF LAB; TAT 18-24 HRS): SARS-CoV-2, NAA: NOT DETECTED

## 2022-01-17 IMAGING — DX DG CHEST 1V PORT
1 series · 1 of 1 positions shown · non-contrast
Comparison: Portable exam 1191 hours compared to 03/17/2020

CLINICAL DATA: Acute respiratory failure with hypoxia, history of
[id number], pulmonary emboli

EXAM:
PORTABLE CHEST 1 VIEW

[chest]
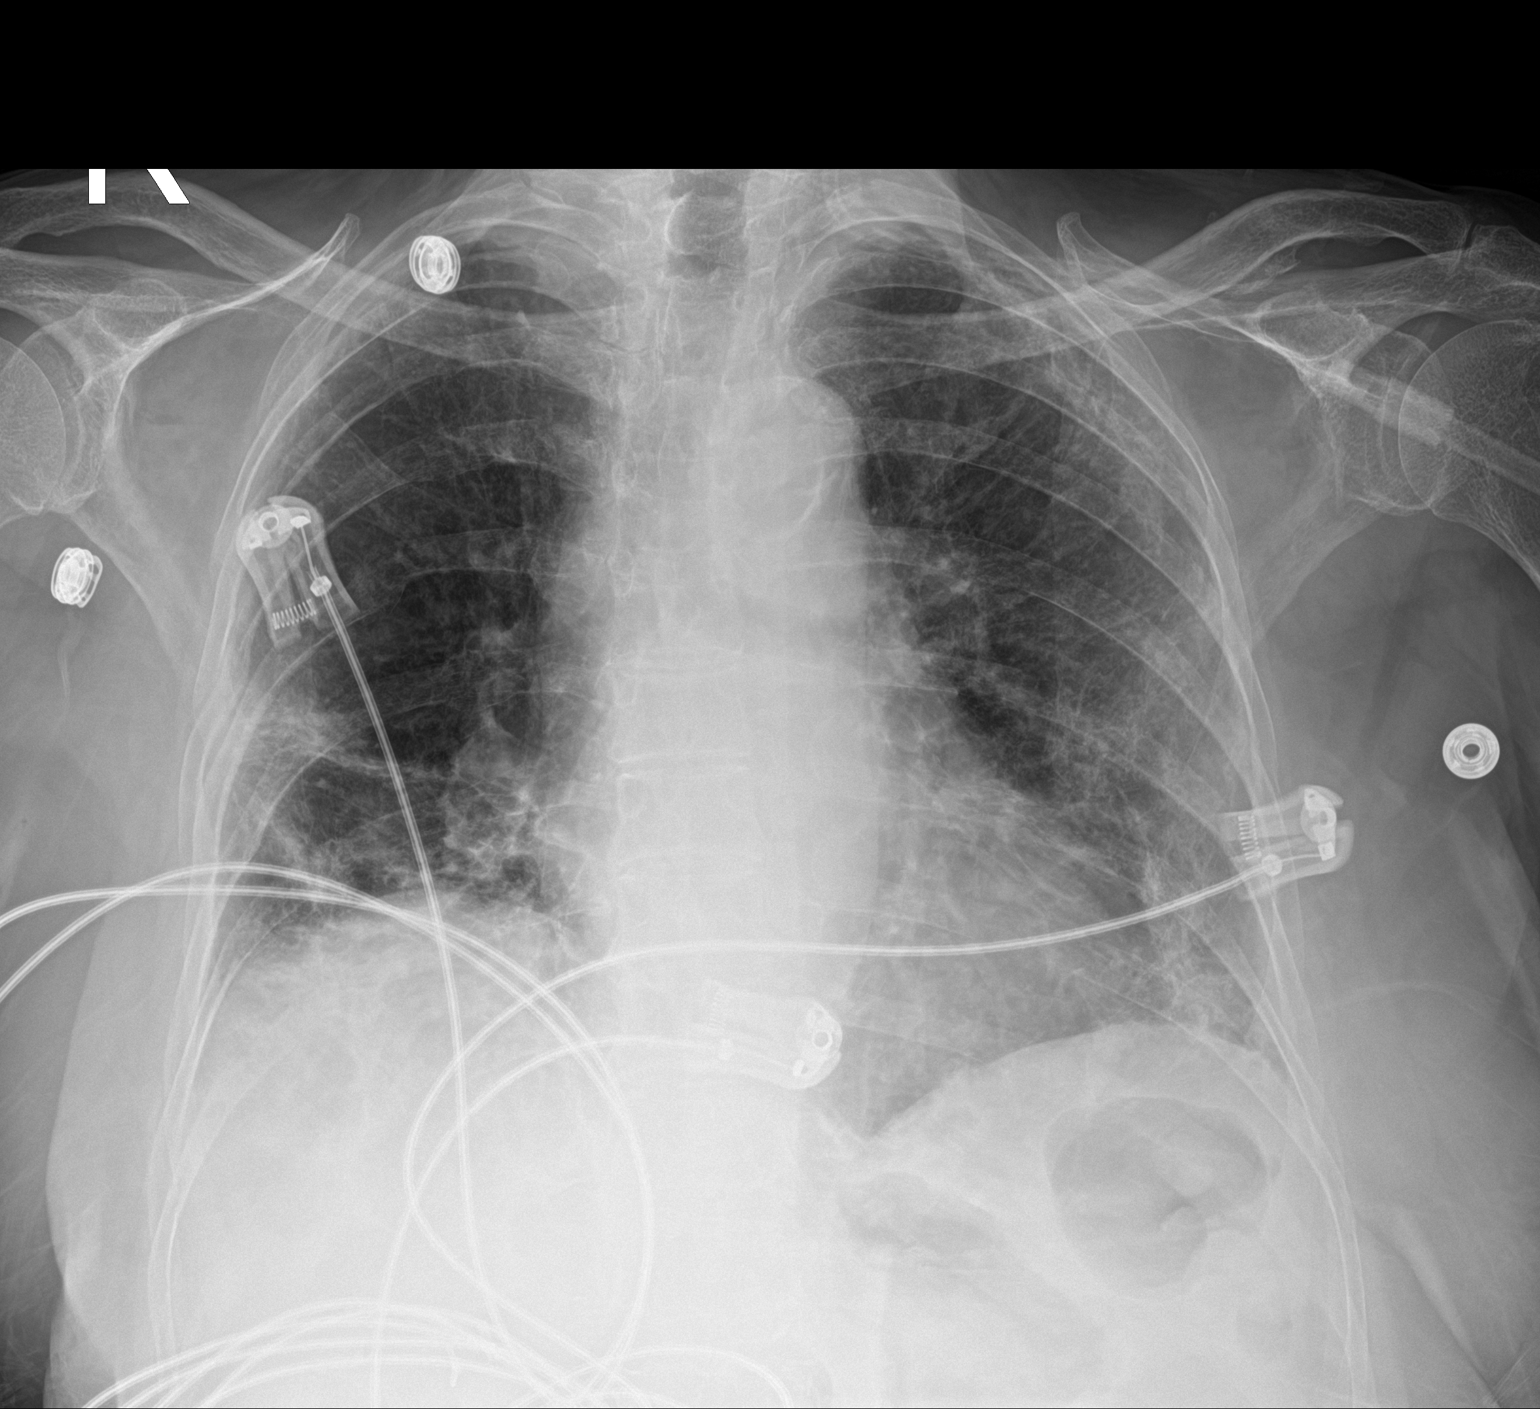

[1 of 1 positions shown; findings below may reference images not displayed]

FINDINGS: Upper normal size of cardiac silhouette.

Atherosclerotic calcification aorta.

Mediastinal contours and pulmonary vascularity normal.

Chronic interstitial infiltrates in the periphery of the LEFT lung
and at the RIGHT base.

Associated RIGHT basilar atelectasis and volume loss.

No pleural effusion or pneumothorax.

Bones demineralized.
IMPRESSION: Chronic interstitial infiltrates throughout periphery of LEFT lung
as well as at RIGHT base, associated with RIGHT basilar atelectasis.

This could represent persistent atypical interstitial infiltrates or
chronic interstitial lung disease/fibrosis.

Appearance is unchanged since 03/17/2020.

Aortic Atherosclerosis ([id number]).
